# Patient Record
Sex: Male | Born: 1985 | Race: Black or African American | Hispanic: No | Marital: Single | Smoking: Former smoker
Health system: Southern US, Community
[De-identification: ages and names within clinical notes are randomized; demographics above are authoritative.]

---
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
## 2013-11-23 ENCOUNTER — Emergency Department (HOSPITAL_COMMUNITY): Payer: Self-pay

## 2013-11-23 ENCOUNTER — Emergency Department (HOSPITAL_COMMUNITY)
Admission: EM | Admit: 2013-11-23 | Discharge: 2013-11-23 | Disposition: A | Discharge status: Home / Self Care | Payer: Self-pay | Attending: Emergency Medicine | Admitting: Emergency Medicine

## 2013-11-23 ENCOUNTER — Encounter (HOSPITAL_COMMUNITY): Payer: Self-pay

## 2013-11-23 DIAGNOSIS — M25569 Pain in unspecified knee: Secondary | ICD-10-CM

## 2013-11-23 DIAGNOSIS — M23351 Other meniscus derangements, posterior horn of lateral meniscus, right knee: Secondary | ICD-10-CM

## 2013-11-23 DIAGNOSIS — R21 Rash and other nonspecific skin eruption: Secondary | ICD-10-CM | POA: Insufficient documentation

## 2013-11-23 DIAGNOSIS — Z720 Tobacco use: Secondary | ICD-10-CM | POA: Insufficient documentation

## 2013-11-23 DIAGNOSIS — M238X1 Other internal derangements of right knee: Secondary | ICD-10-CM | POA: Insufficient documentation

## 2013-11-23 MED ORDER — TRAMADOL HCL 50 MG PO TABS: 50.0000 mg | ORAL_TABLET | Freq: Four times a day (QID) | ORAL | Status: DC | PRN

## 2013-11-23 MED ORDER — IBUPROFEN 800 MG PO TABS: 800.0000 mg | ORAL_TABLET | Freq: Three times a day (TID) | ORAL | Status: DC

## 2013-11-23 MED ORDER — IBUPROFEN 800 MG PO TABS
800.0000 mg | ORAL_TABLET | Freq: Once | ORAL | Status: AC
  Administered 2013-11-23: 800 mg via ORAL
  Filled 2013-11-23: qty 1

## 2013-11-23 NOTE — ED Provider Notes (Signed)
CSN: [id number]     Arrival date & time 11/23/13  1509 History  This chart was scribed for non-physician practitioner working with Brent Munchobert Lockwood, MD by Richarda Overlieichard Holland, ED Scribe. This patient was seen in room WTR7/WTR7 and the patient's care was started at 4:51 PM.     Chief Complaint  Patient presents with  . Knee Pain   The history is provided by the patient. No language interpreter was used.   HPI Comments: Brent Fuentes is a 28 y.o. male who presents to the Emergency Department complaining of right knee pain that started 2 days ago. He was sitting and when he went to stand felt a popping sensation in his knee. He describes the pain as intermittent and sharp that radiates down his right leg. He states he has had this sensation one time in the past where he felt like his knee was out of place. He states his pain is a 6-8/10 when he walks. He states that he was able to get "it back in place." Pt has tried tylenol with no relief. Pt has tried to pop it back in place unsuccessfuly, and ha been using an ACE wrap. No surgical history on the knee. He denies fevers and chills.   Pt complains of a rash in his groin that has been there for the past 2 weeks. He states it has been pruritic and says he has used hydrocortisone cream which failed to relieve his itching. He says the area has not gotten any bigger. He reports he had dysuria for a few days last week that since has self resolved. He reports 1 sexual partner. Denies hematuria, urgency or frequency.     No past medical history on file. No past surgical history on file. No family history on file. History  Substance Use Topics  . Smoking status: Current Every Day Smoker    Types: Cigarettes  . Smokeless tobacco: Not on file  . Alcohol Use: Yes    Review of Systems  Constitutional: Negative for fever and chills.  Genitourinary: Negative for hematuria and testicular pain.  Musculoskeletal: Positive for arthralgias.  Skin: Positive for  rash.    Allergies  Review of patient's allergies indicates no known allergies.  Home Medications   Prior to Admission medications   Not on File   BP 127/78 mmHg  Pulse 83  Temp(Src) 97.5 F (36.4 C) (Oral)  Resp 18  SpO2 100% Physical Exam  Constitutional: He is oriented to person, place, and time. He appears well-developed and well-nourished.  HENT:  Head: Normocephalic and atraumatic.  Neck: Normal range of motion. Neck supple. No tracheal deviation present.  Cardiovascular: Normal rate.   Pulmonary/Chest: Effort normal. No respiratory distress.  Abdominal: He exhibits no distension.  Musculoskeletal:  Right knee tenderness noted to the lateral aspect. Moderate tenderness around knee cap. Normal knee flexion/extension. No joint laxity on anterior posterior drawer test. Pain with various maneuvers.   Neurological: He is alert and oriented to person, place, and time.  Skin: Skin is warm and dry.  Macular papular rash noted to bilateral medial inguinal region without any obvious sings of infection.   Psychiatric: He has a normal mood and affect. His behavior is normal.  Nursing note and vitals reviewed.   ED Course  Procedures  DIAGNOSTIC STUDIES: Oxygen Saturation is 100% on RA, normal by my interpretation.    COORDINATION OF CARE: 4:58 PM Discussed treatment plan with pt at bedside and pt agreed to plan. Ordered rt knee x-ray.  Advised pt to take ibuprofen for pain. Advised pt to try OTC anti-fungal cream for his rash and use once or twice daily for three weeks.    5:08 PM Xray neg.  Given focal point tenderness to latera aspect of R knee, i suspect meniscal injury.  Knee sleeve provided.  Pt has crutches he can use at home.  Ibuprofen provided.  RICE therapy discussed.  Orthopedic referral given.  Pt also has a localized rash to medial thigh bilat.  Suspect fungal infection given the pruritic nature.  Recommend OTC lamisil as treatment.  Return precaution given.     Labs Review Labs Reviewed - No data to display  Imaging Review Dg Knee Complete 4 Views Right  11/23/2013   CLINICAL DATA:  Right knee pain for 2 days, no known injury all my initial encounter  EXAM: RIGHT KNEE - COMPLETE 4+ VIEW  COMPARISON:  None.  FINDINGS: There is no evidence of fracture, dislocation, or joint effusion. There is no evidence of arthropathy or other focal bone abnormality. Soft tissues are unremarkable. Small bone island is noted in the proximal tibia laterally  IMPRESSION: No acute abnormality seen.   Electronically Signed   By: Alcide CleverMark  Lukens M.D.   On: 11/23/2013 16:56     EKG Interpretation None      MDM   Final diagnoses:  Internal derangement of right knee involving posterior horn of lateral meniscus   BP 127/78 mmHg  Pulse 83  Temp(Src) 97.5 F (36.4 C) (Oral)  Resp 18  SpO2 100%  I have reviewed nursing notes and vital signs. I personally reviewed the imaging tests through PACS system  I reviewed available ER/hospitalization records thought the EMR  I personally performed the services described in this documentation, which was scribed in my presence. The recorded information has been reviewed and is accurate.      Fayrene HelperBowie Raniya Golembeski, PA-C 11/23/13 1712  Brent Munchobert Lockwood, MD 11/23/13 [phone]

## 2013-11-23 NOTE — Discharge Instructions (Signed)
Your pain may indicate a meniscal injury.  Please wear knee brace, use crutches as needed, take pain medication as prescribed and follow up closely with orthopedist specialist for further care.    Knee, Cartilage (Meniscus) Injury It is suspected that you have a torn cartilage (meniscus) in your knee. The menisci are made of tough cartilage and fit between the surfaces of the thigh and leg bones. The menisci are C-shaped and have a wedged profile. The wedged profile helps the stability of the joint by keeping the rounded femur surface from sliding off the flat tibial surface. The menisci are fed (nourished) by small blood vessels, but there is also a large area at the inner edge of the meniscus that does not have a good blood supply (avascular). This presents a problem when there is an injury to the meniscus because areas without good blood supply heal poorly. As a result when there is a torn cartilage in the knee, surgery is often required to fix it. This is usually done with a surgical procedure less invasive than open surgery (arthroscopy). Some times open surgery of the knee is required if there is other damage. PURPOSE OF THE MENISCUS The medial meniscus rests on the medial tibial plateau. The tibia is the large bone in your lower leg (the shin bone). The medial tibial plateau is the upper end of the bone making up the inner part of your knee. The lateral meniscus serves the same purpose and is located on the outside of the knee. The menisci help to distribute your body weight across the knee joint; they act as shock absorbers. Without the meniscus present, the weight of your body would be unevenly applied to the bones in your legs (the femur and tibia). The femur is the large bone in your thigh. This uneven weight distribution would cause increased wear and tear on the cartilage lining the joint surfaces, leading to early damage (arthritis) of these areas. The presence of the menisci cartilage is  necessary for a healthy knee. PURPOSE OF THE KNEE CARTILAGE The knee joint is made up of three bones: the thigh bone (femur), the shin bone (tibia), and the knee cap (patella). The surfaces of these bones at the knee joint are covered with cartilage called articular cartilage. This smooth, slippery surface allows the bones to slide against each other without causing bone damage. The meniscus sits between these cartilaginous surfaces of the bones. It distributes the weight evenly in the joints and helps with the stability of the joint (keeps the joint steady). HOME CARE INSTRUCTIONS  Use crutches and external braces as instructed.  Once home, an ice pack applied to your injured knee may help with discomfort and keep the swelling down. An ice pack can be used for the first couple of days or as instructed.  Only take over-the-counter or prescription medicines for pain, discomfort, or fever as directed by your caregiver.  Call if you do not have relief of pain with medications or if there is increasing in pain.  Call if your foot becomes cold or blue.  You may resume normal diet and activities as directed.  Make sure to keep your appointments with your follow-up caregiver. This injury may require further evaluation and treatment beyond the temporary treatment given today. Document Released: 03/21/2002 Document Revised: 05/15/2013 Document Reviewed: 07/13/2008 [facility] Patient Information 2015 [place], [place]. This information is not intended to replace advice given to you by your health care provider. Make sure you discuss any questions you  have with your health care provider. ° °

## 2013-11-23 NOTE — ED Notes (Signed)
Pt states that on Tuesday night, he attempted to get up off ground and felt pop in knee.  Pt is ambulatory but c/o knee to right knee.

## 2013-12-21 ENCOUNTER — Encounter (HOSPITAL_COMMUNITY): Payer: Self-pay

## 2013-12-21 ENCOUNTER — Emergency Department (HOSPITAL_COMMUNITY)
Admission: EM | Admit: 2013-12-21 | Discharge: 2013-12-21 | Disposition: A | Discharge status: Home / Self Care | Payer: Self-pay | Attending: Emergency Medicine | Admitting: Emergency Medicine

## 2013-12-21 ENCOUNTER — Emergency Department (HOSPITAL_COMMUNITY): Payer: Self-pay

## 2013-12-21 DIAGNOSIS — N451 Epididymitis: Secondary | ICD-10-CM | POA: Insufficient documentation

## 2013-12-21 DIAGNOSIS — N50812 Left testicular pain: Secondary | ICD-10-CM

## 2013-12-21 DIAGNOSIS — Z720 Tobacco use: Secondary | ICD-10-CM | POA: Insufficient documentation

## 2013-12-21 DIAGNOSIS — Z791 Long term (current) use of non-steroidal anti-inflammatories (NSAID): Secondary | ICD-10-CM | POA: Insufficient documentation

## 2013-12-21 LAB — URINALYSIS, ROUTINE W REFLEX MICROSCOPIC
BILIRUBIN URINE: NEGATIVE
GLUCOSE, UA: NEGATIVE mg/dL
KETONES UR: NEGATIVE mg/dL
Nitrite: NEGATIVE
Protein, ur: 30 mg/dL — AB
Specific Gravity, Urine: 1.026 (ref 1.005–1.030)
Urobilinogen, UA: 2 mg/dL — ABNORMAL HIGH (ref 0.0–1.0)
pH: 7 (ref 5.0–8.0)

## 2013-12-21 LAB — URINE MICROSCOPIC-ADD ON

## 2013-12-21 MED ORDER — DOXYCYCLINE HYCLATE 100 MG PO TABS
100.0000 mg | ORAL_TABLET | Freq: Once | ORAL | Status: AC
  Administered 2013-12-21: 100 mg via ORAL
  Filled 2013-12-21: qty 1

## 2013-12-21 MED ORDER — LIDOCAINE HCL 1 % IJ SOLN
INTRAMUSCULAR | Status: AC
  Administered 2013-12-21: 1 mL
  Filled 2013-12-21: qty 20

## 2013-12-21 MED ORDER — DOXYCYCLINE HYCLATE 100 MG PO CAPS: 100.0000 mg | ORAL_CAPSULE | Freq: Two times a day (BID) | ORAL | Status: DC

## 2013-12-21 MED ORDER — CEFTRIAXONE SODIUM 250 MG IJ SOLR
250.0000 mg | Freq: Once | INTRAMUSCULAR | Status: AC
  Administered 2013-12-21: 250 mg via INTRAMUSCULAR
  Filled 2013-12-21: qty 250

## 2013-12-21 MED ORDER — OXYCODONE-ACETAMINOPHEN 5-325 MG PO TABS
2.0000 | ORAL_TABLET | Freq: Once | ORAL | Status: AC
  Administered 2013-12-21: 2 via ORAL
  Filled 2013-12-21: qty 2

## 2013-12-21 MED ORDER — OXYCODONE-ACETAMINOPHEN 5-325 MG PO TABS: 1.0000 | ORAL_TABLET | Freq: Four times a day (QID) | ORAL | Status: DC | PRN

## 2013-12-21 NOTE — ED Notes (Signed)
Urinal was given to patient and GC chlamydia is placed at bed side.

## 2013-12-21 NOTE — Discharge Instructions (Signed)
Epididymitis °Epididymitis is a swelling (inflammation) of the epididymis. The epididymis is a cord-like structure along the back part of the testicle. Epididymitis is usually, but not always, caused by infection. This is usually a sudden problem beginning with chills, fever and pain behind the scrotum and in the testicle. There may be swelling and redness of the testicle. °DIAGNOSIS  °Physical examination will reveal a tender, swollen epididymis. Sometimes, cultures are obtained from the urine or from prostate secretions to help find out if there is an infection or if the cause is a different problem. Sometimes, blood work is performed to see if your white blood cell count is elevated and if a germ (bacterial) or viral infection is present. Using this knowledge, an appropriate medicine which kills germs (antibiotic) can be chosen by your caregiver. A viral infection causing epididymitis will most often go away (resolve) without treatment. °HOME CARE INSTRUCTIONS  °· Hot sitz baths for 20 minutes, 4 times per day, may help relieve pain. °· Only take over-the-counter or prescription medicines for pain, discomfort or fever as directed by your caregiver. °· Take all medicines, including antibiotics, as directed. Take the antibiotics for the full prescribed length of time even if you are feeling better. °· It is very important to keep all follow-up appointments. °SEEK IMMEDIATE MEDICAL CARE IF:  °· You have a fever. °· You have pain not relieved with medicines. °· You have any worsening of your problems. °· Your pain seems to come and go. °· You develop pain, redness, and swelling in the scrotum and surrounding areas. °MAKE SURE YOU:  °· Understand these instructions. °· Will watch your condition. °· Will get help right away if you are not doing well or get worse. °Document Released: 12/27/1999 Document Revised: 03/23/2011 Document Reviewed: 11/15/2008 °ExitCare® Patient Information ©2015 [employer]. This information  is not intended to replace advice given to you by your health care provider. Make sure you discuss any questions you have with your health care provider. ° °

## 2013-12-21 NOTE — ED Provider Notes (Signed)
CSN: [id number]     Arrival date & time 12/21/13  0805 History   First MD Initiated Contact with Patient 12/21/13 [phone]     Chief Complaint  Patient presents with  . Testicle Pain     (Consider location/radiation/quality/duration/timing/severity/associated sxs/prior Treatment) Patient is a 28 y.o. male presenting with testicular pain. The history is provided by the patient.  Testicle Pain This is a new problem. The current episode started more than 2 days ago. The problem occurs constantly. The problem has not changed since onset.Pertinent negatives include no shortness of breath. Nothing aggravates the symptoms. Nothing relieves the symptoms. He has tried nothing for the symptoms.    History reviewed. No pertinent past medical history. History reviewed. No pertinent past surgical history. History reviewed. No pertinent family history. History  Substance Use Topics  . Smoking status: Current Every Day Smoker    Types: Cigarettes  . Smokeless tobacco: Not on file  . Alcohol Use: Yes    Review of Systems  Constitutional: Negative for fever and chills.  Respiratory: Negative for cough and shortness of breath.   Genitourinary: Positive for testicular pain.  All other systems reviewed and are negative.     Allergies  Review of patient's allergies indicates no known allergies.  Home Medications   Prior to Admission medications   Medication Sig Start Date End Date Taking? Authorizing Provider  ibuprofen (ADVIL,MOTRIN) 800 MG tablet Take 1 tablet (800 mg total) by mouth 3 (three) times daily. 11/23/13  Yes Fayrene HelperBowie Tran, PA-C  traMADol (ULTRAM) 50 MG tablet Take 1 tablet (50 mg total) by mouth every 6 (six) hours as needed for severe pain. 11/23/13  Yes Fayrene HelperBowie Tran, PA-C   BP 132/84 mmHg  Pulse 95  Temp(Src) 97.9 F (36.6 C) (Oral)  Resp 18  SpO2 100% Physical Exam  Constitutional: He is oriented to person, place, and time. He appears well-developed and well-nourished. No  distress.  HENT:  Head: Normocephalic and atraumatic.  Mouth/Throat: No oropharyngeal exudate.  Eyes: EOM are normal. Pupils are equal, round, and reactive to light.  Neck: Normal range of motion. Neck supple.  Cardiovascular: Normal rate and regular rhythm.  Exam reveals no friction rub.   No murmur heard. Pulmonary/Chest: Effort normal and breath sounds normal. No respiratory distress. He has no wheezes. He has no rales.  Abdominal: He exhibits no distension. There is no tenderness. There is no rebound. Hernia confirmed negative in the right inguinal area and confirmed negative in the left inguinal area.  Genitourinary: Right testis shows no mass, no swelling and no tenderness. Right testis is descended. Left testis shows mass (nodular, enlarged), swelling and tenderness (severe). Left testis is descended.  Musculoskeletal: Normal range of motion. He exhibits no edema.  Lymphadenopathy:       Right: No inguinal adenopathy present.       Left: No inguinal adenopathy present.  Neurological: He is alert and oriented to person, place, and time.  Skin: He is not diaphoretic.  Nursing note and vitals reviewed.   ED Course  Procedures (including critical care time) Labs Review Labs Reviewed  GC/CHLAMYDIA PROBE AMP  URINALYSIS, ROUTINE W REFLEX MICROSCOPIC    Imaging Review [place] Scrotum  12/21/2013   CLINICAL DATA:  Left testicle pain  EXAM: ULTRASOUND OF SCROTUM  TECHNIQUE: Complete ultrasound examination of the testicles, epididymis, and other scrotal structures was performed.  COMPARISON:  None.  FINDINGS: Right testicle  Measurements: 4.0 x 2.1 x 2.4 cm. No mass or microlithiasis visualized.  Left testicle  Measurements: 3.3 x 2.1 x 2.4 cm. No mass or microlithiasis visualized.  Right epididymis:  Normal in size and appearance.  Left epididymis: Enlarged epididymis on the left with increased blood flow suggesting epididymitis. Negative for mass or fluid collection.  Hydrocele:  None  visualized.  Varicocele:  None visualized.  IMPRESSION: Enlarged left epididymis with increased blood flow indicative of epididymitis.  Negative for testicular torsion or mass.   Electronically Signed   By: Marlan Palauharles  Clark M.D.   On: 12/21/2013 09:19   [place] Art/ven Flow Abd Pelv Doppler  12/21/2013   CLINICAL DATA:  Left testicle pain  EXAM: ULTRASOUND OF SCROTUM  TECHNIQUE: Complete ultrasound examination of the testicles, epididymis, and other scrotal structures was performed.  COMPARISON:  None.  FINDINGS: Right testicle  Measurements: 4.0 x 2.1 x 2.4 cm. No mass or microlithiasis visualized.  Left testicle  Measurements: 3.3 x 2.1 x 2.4 cm. No mass or microlithiasis visualized.  Right epididymis:  Normal in size and appearance.  Left epididymis: Enlarged epididymis on the left with increased blood flow suggesting epididymitis. Negative for mass or fluid collection.  Hydrocele:  None visualized.  Varicocele:  None visualized.  IMPRESSION: Enlarged left epididymis with increased blood flow indicative of epididymitis.  Negative for testicular torsion or mass.   Electronically Signed   By: Marlan Palauharles  Clark M.D.   On: 12/21/2013 09:19     EKG Interpretation None      MDM   Final diagnoses:  Pain in left testicle  Epididymitis    46M here with 3 days of left testicle pain. Began after using the bathroom. No trauma. No fevers. No penile discharge, no dysuria or difficulty urinating. On exam, L testicle swollen, nodular. Will US. US shows L sided epididymitis. No evidence of torsion. Does not practice insertive anal sex, can discharge with doxycycline.     Elwin MochaBlair Bernardina Cacho, MD 12/21/13 [phone]

## 2013-12-21 NOTE — ED Notes (Signed)
Per pt, pain and swelling on left testicle since Monday.  Started after using "restroom".  Denies physical activity onset.  Pt states no fever, change in urination or penile discharge.

## 2013-12-21 NOTE — ED Notes (Signed)
Patient states he can not urinate at this time. Will try again later

## 2013-12-22 LAB — GC/CHLAMYDIA PROBE AMP
CT Probe RNA: NEGATIVE
GC Probe RNA: POSITIVE — AB

## 2013-12-23 ENCOUNTER — Telehealth: Payer: Self-pay | Admitting: *Deleted

## 2014-03-12 ENCOUNTER — Telehealth (HOSPITAL_COMMUNITY): Payer: Self-pay

## 2014-03-12 NOTE — Telephone Encounter (Signed)
[facility] 

## 2014-07-02 ENCOUNTER — Encounter (HOSPITAL_COMMUNITY): Payer: Self-pay

## 2014-07-02 ENCOUNTER — Emergency Department (HOSPITAL_COMMUNITY)
Admission: EM | Admit: 2014-07-02 | Discharge: 2014-07-02 | Disposition: A | Discharge status: Home / Self Care | Payer: Self-pay | Attending: Emergency Medicine | Admitting: Emergency Medicine

## 2014-07-02 DIAGNOSIS — R1084 Generalized abdominal pain: Secondary | ICD-10-CM | POA: Insufficient documentation

## 2014-07-02 DIAGNOSIS — R630 Anorexia: Secondary | ICD-10-CM | POA: Insufficient documentation

## 2014-07-02 DIAGNOSIS — Z720 Tobacco use: Secondary | ICD-10-CM | POA: Insufficient documentation

## 2014-07-02 DIAGNOSIS — A770 Spotted fever due to Rickettsia rickettsii: Secondary | ICD-10-CM | POA: Insufficient documentation

## 2014-07-02 LAB — URINALYSIS, ROUTINE W REFLEX MICROSCOPIC
BILIRUBIN URINE: NEGATIVE
Glucose, UA: NEGATIVE mg/dL
Hgb urine dipstick: NEGATIVE
KETONES UR: NEGATIVE mg/dL
Nitrite: NEGATIVE
Protein, ur: NEGATIVE mg/dL
Specific Gravity, Urine: 1.02 (ref 1.005–1.030)
Urobilinogen, UA: 1 mg/dL (ref 0.0–1.0)
pH: 6 (ref 5.0–8.0)

## 2014-07-02 LAB — COMPREHENSIVE METABOLIC PANEL
ALBUMIN: 3.9 g/dL (ref 3.5–5.0)
ALK PHOS: 75 U/L (ref 38–126)
ALT: 16 U/L — ABNORMAL LOW (ref 17–63)
AST: 18 U/L (ref 15–41)
Anion gap: 9 (ref 5–15)
BUN: 9 mg/dL (ref 6–20)
CO2: 21 mmol/L — AB (ref 22–32)
Calcium: 8.9 mg/dL (ref 8.9–10.3)
Chloride: 107 mmol/L (ref 101–111)
Creatinine, Ser: 0.98 mg/dL (ref 0.61–1.24)
GLUCOSE: 93 mg/dL (ref 65–99)
POTASSIUM: 4.3 mmol/L (ref 3.5–5.1)
Sodium: 137 mmol/L (ref 135–145)
Total Bilirubin: 0.2 mg/dL — ABNORMAL LOW (ref 0.3–1.2)
Total Protein: 8.1 g/dL (ref 6.5–8.1)

## 2014-07-02 LAB — CBC WITH DIFFERENTIAL/PLATELET
BASOS ABS: 0 10*3/uL (ref 0.0–0.1)
Basophils Relative: 1 % (ref 0–1)
Eosinophils Absolute: 0.1 10*3/uL (ref 0.0–0.7)
Eosinophils Relative: 1 % (ref 0–5)
HCT: 44 % (ref 39.0–52.0)
HEMOGLOBIN: 14.5 g/dL (ref 13.0–17.0)
LYMPHS PCT: 38 % (ref 12–46)
Lymphs Abs: 3.2 10*3/uL (ref 0.7–4.0)
MCH: 28.2 pg (ref 26.0–34.0)
MCHC: 33 g/dL (ref 30.0–36.0)
MCV: 85.6 fL (ref 78.0–100.0)
Monocytes Absolute: 0.4 10*3/uL (ref 0.1–1.0)
Monocytes Relative: 5 % (ref 3–12)
NEUTROS ABS: 4.5 10*3/uL (ref 1.7–7.7)
Neutrophils Relative %: 55 % (ref 43–77)
Platelets: 336 10*3/uL (ref 150–400)
RBC: 5.14 MIL/uL (ref 4.22–5.81)
RDW: 14.1 % (ref 11.5–15.5)
WBC: 8.2 10*3/uL (ref 4.0–10.5)

## 2014-07-02 LAB — URINE MICROSCOPIC-ADD ON

## 2014-07-02 LAB — LIPASE, BLOOD: LIPASE: 18 U/L — AB (ref 22–51)

## 2014-07-02 LAB — I-STAT TROPONIN, ED: TROPONIN I, POC: 0 ng/mL (ref 0.00–0.08)

## 2014-07-02 MED ORDER — PENICILLIN G BENZATHINE 1200000 UNIT/2ML IM SUSP
2.4000 10*6.[IU] | Freq: Once | INTRAMUSCULAR | Status: AC
  Administered 2014-07-02: 2.4 10*6.[IU] via INTRAMUSCULAR
  Filled 2014-07-02: qty 4

## 2014-07-02 MED ORDER — DOXYCYCLINE HYCLATE 100 MG PO CAPS: 100.0000 mg | ORAL_CAPSULE | Freq: Two times a day (BID) | ORAL | Status: DC

## 2014-07-02 NOTE — ED Notes (Signed)
Pt escorted to discharge window. Pt verbalized understanding discharge instructions. In no acute distress.  

## 2014-07-02 NOTE — ED Provider Notes (Signed)
CSN: [id number]     Arrival date & time 07/02/14  1046 History   First MD Initiated Contact with Patient 07/02/14 1210     Chief Complaint  Patient presents with  . Fatigue  . Sore Throat  . Rash  . Abdominal Pain     (Consider location/radiation/quality/duration/timing/severity/associated sxs/prior Treatment) HPI Comments: Patient was hiking at [facility] about 2-3 weeks ago, did not have any known insect bites.  He denies GU symptoms including rash, ulcerations, dysuria or penile discharge.  No history of STI.  No intraoral lesions, though has had some sore throat  Patient is a 29 y.o. male presenting with pharyngitis, rash, and abdominal pain.  Sore Throat This is a new problem. The current episode started more than 1 week ago (2wks). The problem occurs constantly. The problem has not changed since onset.Associated symptoms include abdominal pain and headaches. Pertinent negatives include no chest pain and no shortness of breath. Associated symptoms comments:  headache, anorexia, fatigue, chills. Nothing aggravates the symptoms. Nothing relieves the symptoms. He has tried nothing for the symptoms. The treatment provided no relief.  Rash Associated symptoms: abdominal pain, fatigue and headaches   Associated symptoms: no diarrhea, no fever, no nausea, no shortness of breath and not vomiting   Abdominal Pain Associated symptoms: chills and fatigue   Associated symptoms: no chest pain, no constipation, no cough, no diarrhea, no dysuria, no fever, no nausea, no shortness of breath and no vomiting     History reviewed. No pertinent past medical history. History reviewed. No pertinent past surgical history. History reviewed. No pertinent family history. History  Substance Use Topics  . Smoking status: Current Every Day Smoker    Types: Cigarettes  . Smokeless tobacco: Not on file  . Alcohol Use: Yes    Review of Systems  Constitutional: Positive for chills, appetite change and  fatigue. Negative for fever and activity change.  HENT: Negative for congestion, facial swelling, rhinorrhea and trouble swallowing.   Eyes: Negative for photophobia and pain.  Respiratory: Negative for cough, chest tightness and shortness of breath.   Cardiovascular: Negative for chest pain and leg swelling.  Gastrointestinal: Positive for abdominal pain. Negative for nausea, vomiting, diarrhea and constipation.  Endocrine: Negative for polydipsia and polyuria.  Genitourinary: Negative for dysuria, urgency, decreased urine volume and difficulty urinating.  Musculoskeletal: Negative for back pain and gait problem.  Skin: Positive for rash. Negative for color change and wound.  Allergic/Immunologic: Negative for immunocompromised state.  Neurological: Positive for headaches. Negative for dizziness, facial asymmetry, speech difficulty, weakness and numbness.  Psychiatric/Behavioral: Negative for confusion, decreased concentration and agitation.      Allergies  Review of patient's allergies indicates no known allergies.  Home Medications   Prior to Admission medications   Medication Sig Start Date End Date Taking? Authorizing Provider  doxycycline (VIBRAMYCIN) 100 MG capsule Take 1 capsule (100 mg total) by mouth 2 (two) times daily. One po bid x 7 days 07/02/14   Toy Cookey, MD  ibuprofen (ADVIL,MOTRIN) 800 MG tablet Take 1 tablet (800 mg total) by mouth 3 (three) times daily. Patient not taking: Reported on 07/02/2014 11/23/13   Fayrene Helper, PA-C  oxyCODONE-acetaminophen (PERCOCET/ROXICET) 5-325 MG per tablet Take 1 tablet by mouth every 6 (six) hours as needed for moderate pain or severe pain. Patient not taking: Reported on 07/02/2014 12/21/13   Elwin Mocha, MD  traMADol (ULTRAM) 50 MG tablet Take 1 tablet (50 mg total) by mouth every 6 (six) hours as needed for  severe pain. Patient not taking: Reported on 07/02/2014 11/23/13   Fayrene Helper, PA-C   BP 123/68 mmHg  Pulse 61  Temp(Src)  98 F (36.7 C) (Oral)  Resp 16  SpO2 99% Physical Exam  Constitutional: He is oriented to person, place, and time. He appears well-developed and well-nourished. No distress.  HENT:  Head: Normocephalic and atraumatic.  Mouth/Throat: No oropharyngeal exudate.  Eyes: Pupils are equal, round, and reactive to light.  Neck: Normal range of motion. Neck supple.  Cardiovascular: Normal rate, regular rhythm and normal heart sounds.  Exam reveals no gallop and no friction rub.   No murmur heard. Pulmonary/Chest: Effort normal and breath sounds normal. No respiratory distress. He has no wheezes. He has no rales.  Abdominal: Soft. Bowel sounds are normal. He exhibits no distension and no mass. There is no tenderness. There is no rebound and no guarding.  Musculoskeletal: Normal range of motion. He exhibits no edema or tenderness.  Neurological: He is alert and oriented to person, place, and time.  Skin: Skin is warm and dry.  Rounded hyperpigmented lesions to the palms and soles.  Several small shallow ulcerations to the low back.  No GU lesions  Psychiatric: He has a normal mood and affect.    ED Course  Procedures (including critical care time) Labs Review Labs Reviewed  COMPREHENSIVE METABOLIC PANEL - Abnormal; Notable for the following:    CO2 21 (*)    ALT 16 (*)    Total Bilirubin 0.2 (*)    All other components within normal limits  LIPASE, BLOOD - Abnormal; Notable for the following:    Lipase 18 (*)    All other components within normal limits  URINALYSIS, ROUTINE W REFLEX MICROSCOPIC (NOT AT [facility]) - Abnormal; Notable for the following:    Leukocytes, UA TRACE (*)    All other components within normal limits  CBC WITH DIFFERENTIAL/PLATELET  URINE MICROSCOPIC-ADD ON  RPR  HIV ANTIBODY (ROUTINE TESTING)  ROCKY MTN SPOTTED FVR ABS PNL(IGG+IGM)  I-STAT TROPOININ, ED    Imaging Review No results found.   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 male with Pmhx as above who presents with nontender rash on palms and soles as well as multiple systemic complaints including fatigue, sore throat, generalized abdominal pain and headache, with which started today.  He reports that the rash on his palms and his soles are not itchy or painful, though he does have several lesions on his low back that are itchy.  He was hiking in the woods about 2 weeks ago had no known tick bites.  He has had no known genital lesions, penile discharge or dysuria.  No history of STI.  On physical exam, vital signs are stable and he is in no acute distress, cardio pulmonary exam is benign.  His multiple rounded hyperpigmented lesions on palms and soles as well as 2-3 lesions on low back which are shallow ulcerated lesions.  No lesions of the GU area.  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  This patient has been hiking recently, the rash could also be consistent with secondary syphilis.  RMSF titer HIV and syphilis RPR have been sent.  Patient we treated with one dose of penicillin G and prescription for 1 week of by mouth doxycycline.  Given.  There have been no GU symptoms.  I more strongly suspect RMSF     Molly Maduro  Erck evaluation in the Emergency Department is complete. It has been determined that no acute conditions requiring further emergency intervention are present at this time. The patient/guardian have been advised of the diagnosis and plan. We have discussed signs and symptoms that warrant return to the ED, such as changes or worsening in symptoms, worsening pain, fever, trouble breathing      Toy Cookey, MD 07/02/14 1351

## 2014-07-02 NOTE — ED Notes (Signed)
Pt c/o rash on bilateral hands, lower back, and bottoms of feet x 2 weeks, sore throat x 1 week, and fatigue, generalized abdominal pain, and headache starting today.  Pain score 8/10.   Pt reports using "cream from a friend" on rash w/o relief.  Pt hasn't taken anything for pain.

## 2014-07-02 NOTE — Progress Notes (Signed)
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  CM discussed and provided written information for self pay pcps, discussed the importance of pcp vs EDP services for f/u care, www.needymeds.org, www.goodrx.com, discounted pharmacies and other [employer] such as [employer] [facility] [facility] , [employer], affordable care act,  [place] med assist, financial assistance, self pay dental services, [place] med assist, DSS and  health department  Reviewed resources for [employer] self pay pcps like [facility], family medicine at [facility], [facility], [facility], local urgent care centers, [facility], [facility] family practice, general medical clinics, family services of the [place], [facility] urgent care plus others, medication resources, [facility] out patient pharmacies and housing Pt voiced understanding and appreciation of resources provided   Provided [facility] contact information Pt states he will soon be getting coverage via his job via [employer].  Cm discussed how to obtain an in network pcp via new insurance website

## 2014-07-02 NOTE — ED Notes (Signed)
Pt alert and oriented x4. Respirations even and unlabored, bilateral symmetrical rise and fall of chest. Skin warm and dry. In no acute distress. Denies needs.   

## 2014-07-02 NOTE — Discharge Instructions (Signed)
[place] Spotted Fever °[place] Spotted Fever (RMSF) is the oldest known tick-borne disease of people in the [place]. This disease was named because it was first described among people in the [place] area who had an illness characterized by a rash with red-purple-black spots. This disease is caused by a rickettsia (Rickettsia rickettsii), a bacteria carried by the tick. °The [place] wood tick and the American dog tick acquire and transmit the RMSF bacteria (pictures NOT actual size). When a larval, nymphal, or adult tick feeds on an infected rodent or larger animal, the tick can become infected. Infected adult ticks then feed on people who may then get RMSF. The tick transmits the disease to humans during a prolonged period of feeding that lasts many hours, days, or even a couple weeks. The bite is painless and frequently goes unnoticed. An infected male tick may also pass the rickettsial bacteria to her eggs that then may mature to be infected adult ticks. °The rickettsia that causes RMSF can also get into a person's body through damaged skin. A tick bite is not necessary. People can get RMSF if they crush a tick and get its blood or body fluids on their skin through a small cut or sore.  °DIAGNOSIS °Diagnosis is made by laboratory tests.  °TREATMENT °Treatment is with antibiotics (medications that kill rickettsia and other bacteria). Immediate treatment usually prevents death. °GEOGRAPHIC RANGE °This disease was reported only in the [place] until 1931. RMSF has more recently been described among individuals in all states except [place], [place], and [place]. The highest reported incidences of RMSF now occur among residents of [place], [place], [place], and the [place]. °TIME OF YEAR  °Most cases are diagnosed during late spring and summer when ticks are most active. However, especially in the warmer southern states, a few cases occur during the winter. °SYMPTOMS   °· Symptoms of RMSF begin from 2 to 14 days after a tick bite. The most common early symptoms are fever, muscle aches, and headache followed by nausea (feeling sick to your stomach) or vomiting. °· The RMSF rash is typically delayed until 3 or more days after symptom onset, and eventually develops in 9 of 10 infected patients by the fifth day of illness. °If the disease is not treated it can cause death. If you get a fever, headache, muscle aches, rash, nausea, or vomiting within 2 weeks of a possible tick bite or exposure, you should see your caregiver immediately. °PREVENTION °Ticks prefer to hide in shady, moist ground litter. They can often be found above the ground clinging to tall grass, brush, shrubs and low tree branches. They also inhabit lawns and gardens, especially at the edges of woodlands and around old stone walls. Within the areas where ticks generally live, no naturally vegetated area can be considered completely free of infected ticks. The best precaution against RMSF is to avoid contact with soil, leaf litter, and vegetation as much as possible in tick-infested areas. For those who enjoy gardening or walking in their yards, clear brush and mow tall grass around houses and at the edges of gardens. This may help reduce the tick population in the immediate area. Applications of chemical insecticides by a licensed professional in the spring (late May) and fall (September) will also control ticks, especially in heavily infested areas. Treatment will never get rid of all the ticks. Getting rid of small animal populations that host ticks will also decrease the tick population. When working in the garden, pruning   shrubs, or handling soil and vegetation, wear light-colored protective clothing and gloves. Spot-check often to prevent ticks from reaching the skin. Ticks cannot jump or fly. They will not drop from an above-ground perch onto a passing animal. Once a tick gains access to human skin it climbs  upward until it reaches a more protected area. For example, the back of the knee, groin, navel, armpit, ears, or nape of the neck. It then begins the slow process of embedding itself in the skin. °Campers, hikers, field workers, and others who spend time in wooded, brushy, or tall grassy areas can avoid exposure to ticks by using the following precautions: °· Wear light-colored clothing with a tight weave to spot ticks more easily and prevent contact with the skin. °· Wear long pants tucked into socks, long-sleeved shirts tucked into pants and enclosed shoes or boots along with insect repellent. °· Spray clothes with insect repellent containing either DEET or Permethrin. Only DEET can be used on exposed skin. Follow the manufacturer's directions carefully. °· Wear a hat and keep long hair pulled back. °· Stay on cleared, well-worn trails whenever possible. °· Spot-check yourself and others often for the presence of ticks on clothes. If you find one, there are likely to be others. Check thoroughly. °· Remove clothes after leaving tick-infested areas. If possible, wash them to eliminate any unseen ticks. Check yourself, your children and any pets from head to toe for the presence of ticks. °· Shower and shampoo. °You can greatly reduce your chances of contracting RMSF if you remove attached ticks as soon as possible. Regular checks of the body, including all body sites covered by hair (head, armpits, genitals), allow removal of the tick before rickettsial transmission. To remove an attached tick, use a forceps or tweezers to detach the intact tick without leaving mouth parts in the skin. The tick bite wound should be cleansed after tick removal. °Remember the most common symptoms of RMSF are fever, muscle aches, headache, and nausea or vomiting with a later onset of rash. If you get these symptoms after a tick bite and while living in an area where RMSF is found, RMSF should be suspected. If the disease is not  treated, it can cause death. See your caregiver immediately if you get these symptoms. Do this even if not aware of a tick bite. °Document Released: 04/12/2000 Document Revised: 05/15/2013 Document Reviewed: 12/03/2008 °ExitCare® Patient Information ©2015 [employer]. This information is not intended to replace advice given to you by your health care provider. Make sure you discuss any questions you have with your health care provider. ° °

## 2014-07-03 LAB — RPR: RPR Ser Ql: REACTIVE — AB

## 2014-07-03 LAB — RPR, QUANT+TP ABS (REFLEX)
Rapid Plasma Reagin, Quant: 1:512 {titer} — ABNORMAL HIGH
T Pallidum Abs: POSITIVE — AB

## 2014-07-03 LAB — HIV ANTIBODY (ROUTINE TESTING W REFLEX): HIV SCREEN 4TH GENERATION: NONREACTIVE

## 2014-07-04 ENCOUNTER — Telehealth (HOSPITAL_BASED_OUTPATIENT_CLINIC_OR_DEPARTMENT_OTHER): Payer: Self-pay | Admitting: Emergency Medicine

## 2014-07-04 LAB — ROCKY MTN SPOTTED FVR ABS PNL(IGG+IGM)
RMSF IgG: NEGATIVE
RMSF IgM: 0.23 index (ref 0.00–0.89)

## 2014-07-04 NOTE — Telephone Encounter (Signed)
Pt notified of +RPR, was treated with Bicillin in the ED, educated re abstinence and notification of sexual partner(s)

## 2015-08-19 ENCOUNTER — Emergency Department (HOSPITAL_COMMUNITY): Payer: Managed Care, Other (non HMO)

## 2015-08-19 ENCOUNTER — Emergency Department (HOSPITAL_COMMUNITY)
Admission: EM | Admit: 2015-08-19 | Discharge: 2015-08-19 | Disposition: A | Discharge status: Home / Self Care | Payer: Managed Care, Other (non HMO) | Attending: Emergency Medicine | Admitting: Emergency Medicine

## 2015-08-19 ENCOUNTER — Encounter (HOSPITAL_COMMUNITY): Payer: Self-pay | Admitting: Vascular Surgery

## 2015-08-19 DIAGNOSIS — F17210 Nicotine dependence, cigarettes, uncomplicated: Secondary | ICD-10-CM | POA: Diagnosis not present

## 2015-08-19 DIAGNOSIS — R0789 Other chest pain: Secondary | ICD-10-CM | POA: Insufficient documentation

## 2015-08-19 LAB — BASIC METABOLIC PANEL
ANION GAP: 10 (ref 5–15)
BUN: 8 mg/dL (ref 6–20)
CHLORIDE: 102 mmol/L (ref 101–111)
CO2: 25 mmol/L (ref 22–32)
Calcium: 9.3 mg/dL (ref 8.9–10.3)
Creatinine, Ser: 1.16 mg/dL (ref 0.61–1.24)
GFR calc Af Amer: >60 mL/min (ref >60)
GFR calc non Af Amer: >60 mL/min (ref >60)
GLUCOSE: 93 mg/dL (ref 65–99)
POTASSIUM: 3.8 mmol/L (ref 3.5–5.1)
SODIUM: 137 mmol/L (ref 135–145)

## 2015-08-19 LAB — I-STAT TROPONIN, ED: Troponin i, poc: 0 ng/mL (ref 0.00–0.08)

## 2015-08-19 LAB — CBC
HEMATOCRIT: 46.2 % (ref 39.0–52.0)
HEMOGLOBIN: 14.9 g/dL (ref 13.0–17.0)
MCH: 27.7 pg (ref 26.0–34.0)
MCHC: 32.3 g/dL (ref 30.0–36.0)
MCV: 85.9 fL (ref 78.0–100.0)
Platelets: 355 10*3/uL (ref 150–400)
RBC: 5.38 MIL/uL (ref 4.22–5.81)
RDW: 13.7 % (ref 11.5–15.5)
WBC: 8.5 10*3/uL (ref 4.0–10.5)

## 2015-08-19 MED ORDER — NAPROXEN 375 MG PO TABS: 375.0000 mg | ORAL_TABLET | Freq: Two times a day (BID) | ORAL | 0 refills | Status: DC

## 2015-08-19 MED ORDER — CYCLOBENZAPRINE HCL 10 MG PO TABS: 10.0000 mg | ORAL_TABLET | Freq: Two times a day (BID) | ORAL | 0 refills | Status: DC | PRN

## 2015-08-19 MED ORDER — HYDROCODONE-ACETAMINOPHEN 5-325 MG PO TABS
2.0000 | ORAL_TABLET | Freq: Once | ORAL | Status: AC
  Administered 2015-08-19: 2 via ORAL
  Filled 2015-08-19: qty 2

## 2015-08-19 NOTE — ED Provider Notes (Signed)
[facility]-EMERGENCY DEPT Provider Note   CSN: [id number] Arrival date & time: 08/19/15  1217  First Provider Contact:  First MD Initiated Contact with Patient 08/19/15 1710        History   Chief Complaint Chief Complaint  Patient presents with  . Chest Pain    HPI Brent Fuentes is a 30 y.o. male.  Patient presents to the emergency department with chief complaint of chest pain. He states that the symptoms started 3 days ago. States that they're worsened when he takes a deep breath and when he flexes his chest or when his left chest is palpated. He denies any shortness breath. Denies any fevers, chills, or cough. He denies any history of asthma or COPD. He does smoke.   The history is provided by the patient. No language interpreter was used.    History reviewed. No pertinent past medical history.  There are no active problems to display for this patient.   History reviewed. No pertinent surgical history.     Home Medications    Prior to Admission medications   Medication Sig Start Date End Date Taking? Authorizing Provider  cyclobenzaprine (FLEXERIL) 10 MG tablet Take 1 tablet (10 mg total) by mouth 2 (two) times daily as needed for muscle spasms. 08/19/15   Roxy Horsemanobert Madolin Twaddle, PA-C  doxycycline (VIBRAMYCIN) 100 MG capsule Take 1 capsule (100 mg total) by mouth 2 (two) times daily. One po bid x 7 days 07/02/14   Toy CookeyMegan Docherty, MD  ibuprofen (ADVIL,MOTRIN) 800 MG tablet Take 1 tablet (800 mg total) by mouth 3 (three) times daily. Patient not taking: Reported on 07/02/2014 11/23/13   Fayrene HelperBowie Tran, PA-C  naproxen (NAPROSYN) 375 MG tablet Take 1 tablet (375 mg total) by mouth 2 (two) times daily. 08/19/15   Roxy Horsemanobert Euell Schiff, PA-C  oxyCODONE-acetaminophen (PERCOCET/ROXICET) 5-325 MG per tablet Take 1 tablet by mouth every 6 (six) hours as needed for moderate pain or severe pain. Patient not taking: Reported on 07/02/2014 12/21/13   Elwin MochaBlair Walden, MD  traMADol (ULTRAM) 50 MG tablet Take 1  tablet (50 mg total) by mouth every 6 (six) hours as needed for severe pain. Patient not taking: Reported on 07/02/2014 11/23/13   Fayrene HelperBowie Tran, PA-C    Family History History reviewed. No pertinent family history.  Social History Social History  Substance Use Topics  . Smoking status: Current Every Day Smoker    Types: Cigarettes  . Smokeless tobacco: Never Used  . Alcohol use Yes     Allergies   Review of patient's allergies indicates no known allergies.   Review of Systems Review of Systems  Cardiovascular: Positive for chest pain.  All other systems reviewed and are negative.    Physical Exam Updated Vital Signs BP 123/80 (BP Location: Left Arm)   Pulse (!) 55   Temp 98.1 F (36.7 C) (Oral)   Resp 17   Ht 6' (1.829 m)   Wt 131.5 kg   SpO2 100%   BMI 39.33 kg/m   Physical Exam  Constitutional: He is oriented to person, place, and time. He appears well-developed and well-nourished.  HENT:  Head: Normocephalic and atraumatic.  Eyes: Conjunctivae and EOM are normal. Pupils are equal, round, and reactive to light. Right eye exhibits no discharge. Left eye exhibits no discharge. No scleral icterus.  Neck: Normal range of motion. Neck supple. No JVD present.  Cardiovascular: Normal rate, regular rhythm and normal heart sounds.  Exam reveals no gallop and no friction rub.   No murmur  heard. Pulmonary/Chest: Effort normal and breath sounds normal. No respiratory distress. He has no wheezes. He has no rales. He exhibits no tenderness.  Left anterior chest wall tender to palpation, also reproducible pain with chest flexion  Abdominal: Soft. He exhibits no distension and no mass. There is no tenderness. There is no rebound and no guarding.  Musculoskeletal: Normal range of motion. He exhibits no edema or tenderness.  Neurological: He is alert and oriented to person, place, and time.  Skin: Skin is warm and dry.  Psychiatric: He has a normal mood and affect. His behavior is  normal. Judgment and thought content normal.  Nursing note and vitals reviewed.    ED Treatments / Results  Labs (all labs ordered are listed, but only abnormal results are displayed) Labs Reviewed  BASIC METABOLIC PANEL  CBC  I-STAT TROPOININ, ED    EKG  EKG Interpretation None       Radiology Dg Chest 2 View  Result Date: 08/19/2015 CLINICAL DATA:  Left chest pain getting worse since Saturday. EXAM: CHEST  2 VIEW COMPARISON:  None. FINDINGS: The heart size and mediastinal contours are within normal limits. Both lungs are clear. The visualized skeletal structures are unremarkable. IMPRESSION: No active cardiopulmonary disease. Electronically Signed   By: Kennith Center M.D.   On: 08/19/2015 13:25    Procedures Procedures (including critical care time)  Medications Ordered in ED Medications  HYDROcodone-acetaminophen (NORCO/VICODIN) 5-325 MG per tablet 2 tablet (not administered)     Initial Impression / Assessment and Plan / ED Course  I have reviewed the triage vital signs and the nursing notes.  Pertinent labs & imaging results that were available during my care of the patient were reviewed by me and considered in my medical decision making (see chart for details).  Clinical Course   Patient with chest pain 3 days. No associated shortness breath. Chest pain is reproducible with palpation and movement. Troponin ordered in triage is negative. EKG shows no ischemic findings. Patient is young and otherwise healthy, low risk for ACS. No tachycardia, no hypoxia, no leg swelling, low risk for PE. As the pain is reproducible, will treat for musculoskeletal pain. Treat with Flexeril and Naprosyn. Patient is stable and ready for discharge.  Final Clinical Impressions(s) / ED Diagnoses   Final diagnoses:  Chest wall pain    New Prescriptions New Prescriptions   CYCLOBENZAPRINE (FLEXERIL) 10 MG TABLET    Take 1 tablet (10 mg total) by mouth 2 (two) times daily as needed for  muscle spasms.   NAPROXEN (NAPROSYN) 375 MG TABLET    Take 1 tablet (375 mg total) by mouth 2 (two) times daily.     Roxy Horseman, PA-C 08/19/15 1724    Pricilla Loveless, MD 08/20/15 [phone]

## 2015-08-19 NOTE — ED Triage Notes (Signed)
Pt reports to the ED for eval of CP. Onset on Saturday. Movement and deep breaths make the pain worse. Describes it as a spasm-like pain. Pt reports some mild SOB when the pain is severe and some dizziness. Pt A&Ox4, resp e/u, and skin warm and dry.

## 2018-08-06 IMAGING — DX DG CHEST 2V
2 series · 2 of 2 positions shown · non-contrast
Comparison: None.

CLINICAL DATA: Left chest pain getting worse since [REDACTED].

EXAM:
CHEST  2 VIEW

[w chest pa]
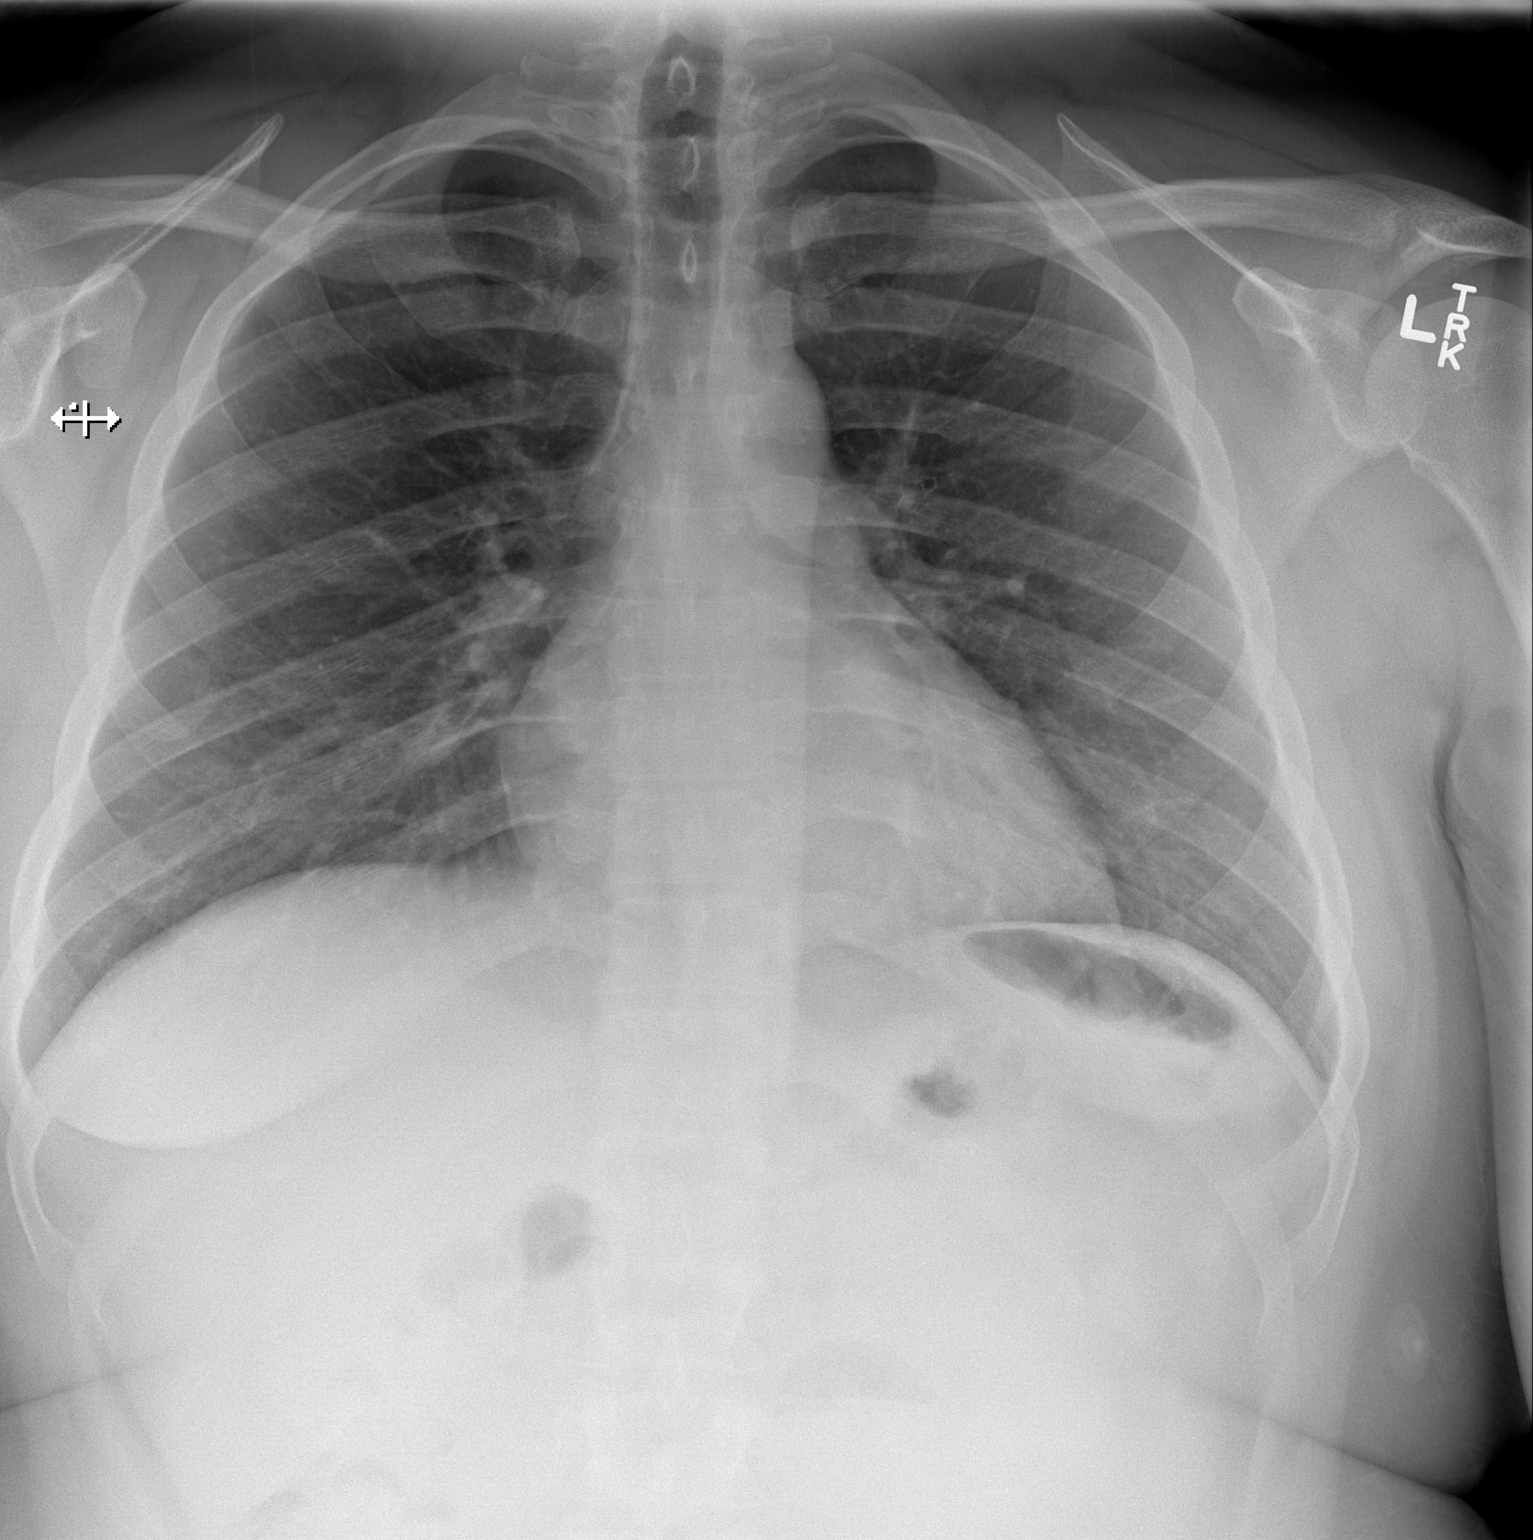

[w chest lat]
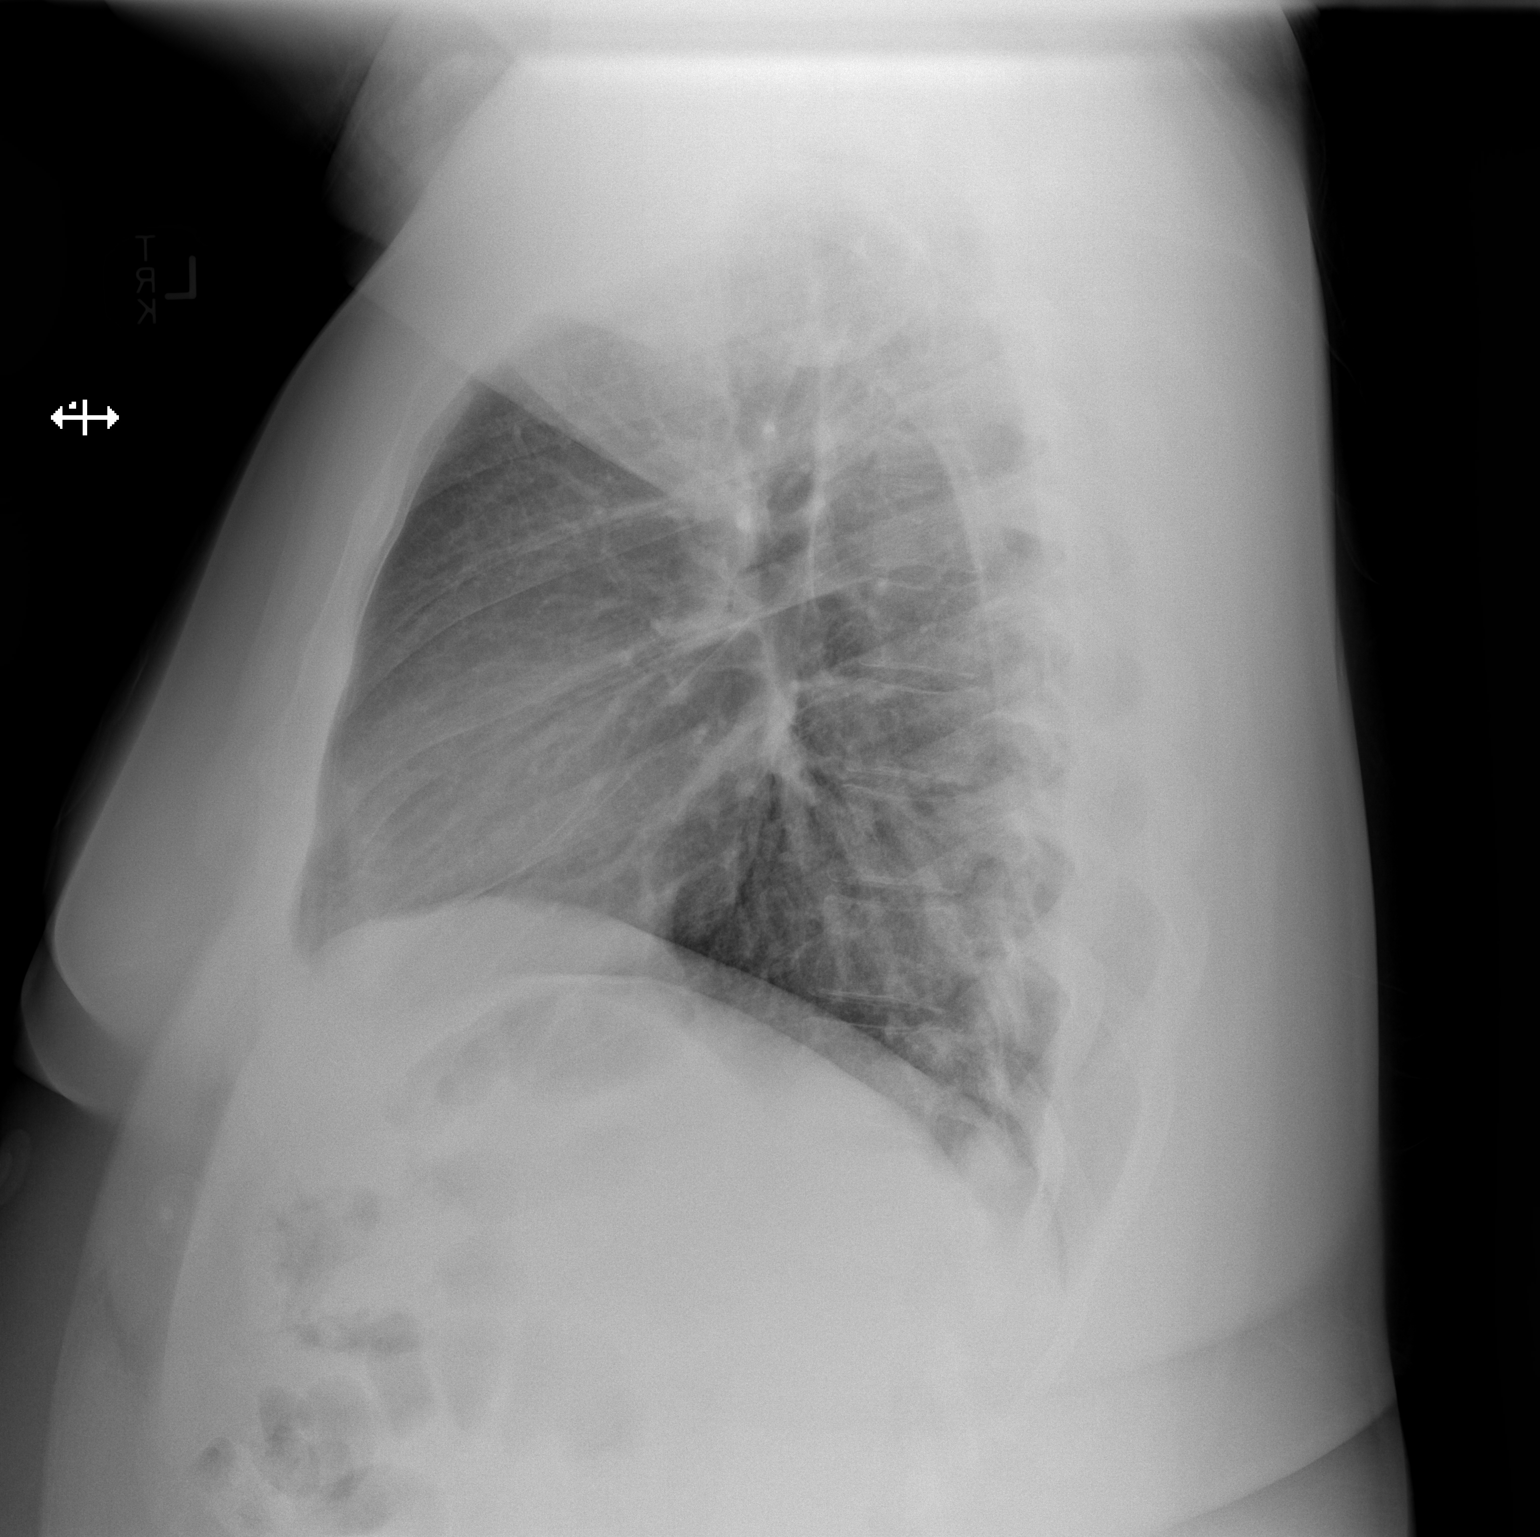

[2 of 2 positions shown; findings below may reference images not displayed]

FINDINGS: The heart size and mediastinal contours are within normal limits.
Both lungs are clear. The visualized skeletal structures are
unremarkable.
IMPRESSION: No active cardiopulmonary disease.

## 2020-06-03 ENCOUNTER — Ambulatory Visit
Admission: EM | Admit: 2020-06-03 | Discharge: 2020-06-03 | Disposition: A | Discharge status: Home / Self Care | Payer: Managed Care, Other (non HMO) | Attending: Emergency Medicine | Admitting: Emergency Medicine

## 2020-06-03 ENCOUNTER — Telehealth: Payer: Managed Care, Other (non HMO) | Admitting: Physician Assistant

## 2020-06-03 ENCOUNTER — Other Ambulatory Visit: Payer: Self-pay

## 2020-06-03 DIAGNOSIS — R1110 Vomiting, unspecified: Secondary | ICD-10-CM

## 2020-06-03 DIAGNOSIS — Z20822 Contact with and (suspected) exposure to covid-19: Secondary | ICD-10-CM

## 2020-06-03 DIAGNOSIS — K047 Periapical abscess without sinus: Secondary | ICD-10-CM | POA: Insufficient documentation

## 2020-06-03 DIAGNOSIS — R519 Headache, unspecified: Secondary | ICD-10-CM

## 2020-06-03 DIAGNOSIS — J0100 Acute maxillary sinusitis, unspecified: Secondary | ICD-10-CM | POA: Insufficient documentation

## 2020-06-03 LAB — POCT URINALYSIS DIP (MANUAL ENTRY)
Blood, UA: NEGATIVE
Glucose, UA: NEGATIVE mg/dL
Nitrite, UA: NEGATIVE
Protein Ur, POC: 30 mg/dL — AB
Spec Grav, UA: 1.015 (ref 1.010–1.025)
Urobilinogen, UA: 4 E.U./dL — AB
pH, UA: 6 (ref 5.0–8.0)

## 2020-06-03 MED ORDER — FLUTICASONE PROPIONATE 50 MCG/ACT NA SUSP: 2.0000 | Freq: Every day | NASAL | 0 refills | Status: AC

## 2020-06-03 MED ORDER — AMOXICILLIN-POT CLAVULANATE 875-125 MG PO TABS: 1.0000 | ORAL_TABLET | Freq: Two times a day (BID) | ORAL | 0 refills | Status: AC

## 2020-06-03 MED ORDER — ONDANSETRON 4 MG PO TBDP
4.0000 mg | ORAL_TABLET | Freq: Once | ORAL | Status: AC
  Administered 2020-06-03: 4 mg via ORAL

## 2020-06-03 MED ORDER — ACETAMINOPHEN 325 MG PO TABS
650.0000 mg | ORAL_TABLET | Freq: Once | ORAL | Status: AC
  Administered 2020-06-03: 650 mg via ORAL

## 2020-06-03 MED ORDER — ONDANSETRON 8 MG PO TBDP: ORAL_TABLET | ORAL | 0 refills | Status: AC

## 2020-06-03 MED ORDER — IBUPROFEN 600 MG PO TABS
600.0000 mg | ORAL_TABLET | Freq: Four times a day (QID) | ORAL | 0 refills | Status: AC | PRN
Start: 2020-06-03 — End: ?

## 2020-06-03 NOTE — ED Triage Notes (Signed)
Pt c/o headache, fatigue, nausea, and vomiting today. States no appetite. States wasn't feeling well yesterday. States last ibuprofen last night.

## 2020-06-03 NOTE — Progress Notes (Signed)
Based on what you shared with me, I feel your condition warrants further evaluation and I recommend that you be seen in a face to face office visit.  Vomiting in the setting of headache and fever is always concerning for a more complicated infection.  You will need a physical exam to ensure safe treatment of your symptoms.   NOTE: If you entered your credit card information for this eVisit, you will not be charged. You may see a "hold" on your card for the $35 but that hold will drop off and you will not have a charge processed.   If you are having a true medical emergency please call 911.      For an urgent face to face visit, [place] has six urgent care centers for your convenience:     [facility] at [facility] Directions [phone] [address] [place], [place] [address] . 8 am - 4 pm Monday - Friday    [facility]) Get Driving Directions [phone] [address] [place], [place] [address] . 8 am to 8 pm Monday-Friday . 10 am to 6 pm [facility] Urgent [facility]) Get Driving Directions [phone]  [address] [place],  [place]  [address] . 8 am to 8 pm Monday-Friday . 8 am to 4 pm [facility] Urgent Care at [facility] Get Driving Directions [phone] [address] [place], [place] [address] . 8 am to 8 pm Monday-Friday . 8 am to 4 pm [facility] Urgent Care at [facility] Get Driving Directions  [phone] [address] [place], [place] [address] . 8 am to 8 pm Monday-Friday . 8 am to 4 pm [facility] Urgent Care at [facility] Directions [phone] [address], [address] [place], [place] [address] . 8 am to 8 pm Monday-Friday . 8 am to 4 pm Saturday-Sunday     Your MyChart E-visit questionnaire answers  were reviewed by a board certified advanced clinical practitioner to complete your personal care plan based on your specific symptoms.  Thank you for using e-Visits.   Greater than 5 minutes, yet less than 10 minutes of time have been spent researching, coordinating, and implementing care for this patient today

## 2020-06-03 NOTE — Discharge Instructions (Addendum)
Your urine showed that you are dehydrated.  There is no blood in your urine that would be concerning for a kidney stone.  I am going to send your urine off for culture to make sure you have a urinary tract infection as well.  Take Zofran as needed for nausea, vomiting.  600 mg of ibuprofen combined with 1000 mg of Tylenol together 3-4 times a day as needed for pain, fever, body aches.  Make sure you drink plenty of extra electrolyte containing fluids such as Pedialyte or Gatorade.  Go immediately to the ER if your pain gets worse, for fevers despite the Tylenol/ibuprofen, or for any other concerns.  COVID and flu test will be back in several days.  Below is a list of primary care practices who are taking new patients for you to follow-up with.  [facility] internal medicine clinic Ground Floor - [facility], [address] [place], [place], [place] [address] [phone]  [facility] Primary Care at [facility] [address] [place], [place] [address] [phone]  [facility] [address] [place], [place] [address] [phone]  [facility]/Internal Medicine [phone] [address] [place] [address]  [facility]: [address] [place] [address]  [phone]  [facility]gent [facility]: [address] [place] [address]   [phone]  [facility] Family Medicine: [address] [place] [address]  [phone]  [facility] : [address] [place] [address] [phone]  [facility] Primary Care: [address] [place] [address] [phone]  [facility]: [address] [place] [address] [phone]  Dr. Oneal Grout [address] [facility] [place] [address]  [phone]  Go  to www.goodrx.com  or www.costplusdrugs.com to look up your medications. This will give you a list of where you can find your prescriptions at the most affordable prices. Or ask the pharmacist what the cash price is, or if they have any other discount programs available to help make your medication more affordable. This can be less expensive than what you would pay with insurance.

## 2020-06-03 NOTE — ED Provider Notes (Addendum)
HPI  SUBJECTIVE:  Brent Fuentes is a 35 y.o. male who presents with right upper molar pain, gingival swelling 3 days ago.  This has resolved.  He states that this tooth is cracked.  Denies recent trauma to the tooth.  He now reports Headaches, body aches, fevers T-max 101.7 measured here, fatigue, nasal congestion, rhinorrhea, sinus pain and pressure, cough, anorexia, nausea, 2 episodes of greenish emesis and upper intermittent abdominal pain described as throbbing, stabbing, pressure.  It lasts minutes to hours.  No wheezing, shortness of breath, loss of sense of smell or taste, sore throat.  He has not tried anything for his abdominal pain.  It is worse before vomiting, better afterwards.  He denies abdominal distention.  States that the car ride over here was not painful.  He tried ibuprofen yesterday with improvement in his overall symptoms.  Symptoms are worse with standing up, trying to eat.  No known COVID or flu exposure.  He did not get either vaccine.  Past medical history of COVID in February 2020, December 2021.  No history of diabetes, hypertension, gallbladder disease, cryptitis, abdominal surgeries, UTI, pyelonephritis, nephrolithiasis.  PMD: None.   History reviewed. No pertinent past medical history.  History reviewed. No pertinent surgical history.  History reviewed. No pertinent family history.  Social History   Tobacco Use  . Smoking status: Former Smoker    Types: Cigarettes  . Smokeless tobacco: Never Used  Substance Use Topics  . Alcohol use: Yes  . Drug use: Yes    Types: Marijuana    No current facility-administered medications for this encounter.  Current Outpatient Medications:  .  amoxicillin-clavulanate (AUGMENTIN) 875-125 MG tablet, Take 1 tablet by mouth 2 (two) times daily. X 7 days, Disp: 14 tablet, Rfl: 0 .  fluticasone (FLONASE) 50 MCG/ACT nasal spray, Place 2 sprays into both nostrils daily., Disp: 16 g, Rfl: 0 .  ibuprofen (ADVIL) 600 MG tablet,  Take 1 tablet (600 mg total) by mouth every 6 (six) hours as needed., Disp: 30 tablet, Rfl: 0 .  ondansetron (ZOFRAN ODT) 8 MG disintegrating tablet, 1/2- 1 tablet q 8 hr prn nausea, vomiting, Disp: 20 tablet, Rfl: 0  No Known Allergies   ROS  As noted in HPI.   Physical Exam  BP 133/85 (BP Location: Left Arm)   Pulse 99   Temp (!) 101.7 F (38.7 C) (Oral)   Resp 18   SpO2 96%   Constitutional: Well developed, well nourished, no acute distress appears ill. Eyes: PERRL, EOMI, conjunctiva normal bilaterally HENT: Normocephalic, atraumatic,mucus membranes moist. positive nasal congestion.  Erythematous, swollen turbinates.  Positive right-sided maxillary sinus tenderness.  Right upper third molar tender to palpation.  No surrounding gingival swelling.  No expressible drainage.  No facial swelling, drooling, trismus. Neck: Positive cervical lymphadenopathy Respiratory: Clear to auscultation bilaterally, no rales, no wheezing, no rhonchi Cardiovascular: Normal rate and rhythm, no murmurs, no gallops, no rubs GI: Soft, nondistended, normal bowel sounds, positive left flank tenderness, no rebound, no guarding.  No other abdominal tenderness. Back: Left CVAT skin: No rash, skin intact Musculoskeletal: No edema, no tenderness, no deformities Neurologic: Alert & oriented x 3, CN III-XII grossly intact, no motor deficits, sensation grossly intact Psychiatric: Speech and behavior appropriate   ED Course   Medications  acetaminophen (TYLENOL) tablet 650 mg (650 mg Oral Given 06/03/20 1926)  ondansetron (ZOFRAN-ODT) disintegrating tablet 4 mg (4 mg Oral Given 06/03/20 2025)    Orders Placed This Encounter  Procedures  .  Covid-19, Flu A+B ([facility])    Standing Status:   Standing    Number of Occurrences:   1  . Urine Culture    Standing Status:   Standing    Number of Occurrences:   1  . POCT urinalysis dipstick    Standing Status:   Standing    Number of Occurrences:   1   No  results found for this or any previous visit (from the past 24 hour(s)). No results found.  Results for orders placed or performed during the hospital encounter of 06/03/20  Covid-19, Flu A+B ([facility])   Specimen: Nasopharyngeal   Naso  Result Value Ref Range   SARS-CoV-2, NAA Not Detected Not Detected   Influenza A, NAA Not Detected Not Detected   Influenza B, NAA Not Detected Not Detected  Urine Culture   Specimen: Urine, Clean Catch  Result Value Ref Range   Specimen Description URINE, CLEAN CATCH    Special Requests NONE    Culture (A)     <10,000 COLONIES/mL INSIGNIFICANT GROWTH Performed at [facility] Lab, [address] [place], [place] [address]    Report Status 06/06/2020 FINAL   POCT urinalysis dipstick  Result Value Ref Range   Color, UA brown (A) yellow   Clarity, UA cloudy (A) clear   Glucose, UA negative negative mg/dL   Bilirubin, UA small (A) negative   Ketones, POC UA moderate (40) (A) negative mg/dL   Spec Grav, UA 2.585 2.778 - 1.025   Blood, UA negative negative   pH, UA 6.0 5.0 - 8.0   Protein Ur, POC =30 (A) negative mg/dL   Urobilinogen, UA 4.0 (A) 0.2 or 1.0 E.U./dL   Nitrite, UA Negative Negative   Leukocytes, UA Trace (A) Negative     ED Clinical Impression  1. Acute non-recurrent maxillary sinusitis   2. Encounter for screening laboratory testing for COVID-19 virus   3. Dental infection      ED Assessment/Plan  Patient was given Tylenol for fever.  He declined Zofran.  Unsure as to whether the symptoms are from an infected tooth, sinusitis, COVID or flu.  He does have some left flank and CVA tenderness, so we will check a urinalysis, in addition to COVID, flu.  He will qualify for antiviral treatment if COVID is positive based on BMI.  May be out of the window for flu treatment.  Pt abd exam is benign, no peritoneal signs. No evidence of surgical abd. Doubt SBO, mesenteric ischemia, appendicitis, hepatitis, cholecystitis, pancreatitis,  or perforated viscus.  Plan to send home with saline nasal irrigation, Mucinex, Flonase for sinus infection.  Augmentin for 7 days which will also cover dental infection.  Zofran as needed for nausea, vomiting, Tylenol/ibuprofen, push electrolyte containing fluids.  Will provide primary care list for ongoing care and order assistance in finding a PMD.  Strict ER return precautions given.  Urinalysis: Appears dehydrated.  No hematuria.  He has trace leuks, will send this off for urine culture prior to treating for UTI.    Addendum: COVID, flu, urine culture negative.  Discussed labs,  MDM, treatment plan, and plan for follow-up with patient Discussed sn/sx that should prompt return to the ED. patient agrees with plan.   Meds ordered this encounter  Medications  . acetaminophen (TYLENOL) tablet 650 mg  . ondansetron (ZOFRAN-ODT) disintegrating tablet 4 mg  . amoxicillin-clavulanate (AUGMENTIN) 875-125 MG tablet    Sig: Take 1 tablet by mouth 2 (two) times daily. X 7  days    Dispense:  14 tablet    Refill:  0  . ibuprofen (ADVIL) 600 MG tablet    Sig: Take 1 tablet (600 mg total) by mouth every 6 (six) hours as needed.    Dispense:  30 tablet    Refill:  0  . ondansetron (ZOFRAN ODT) 8 MG disintegrating tablet    Sig: 1/2- 1 tablet q 8 hr prn nausea, vomiting    Dispense:  20 tablet    Refill:  0  . fluticasone (FLONASE) 50 MCG/ACT nasal spray    Sig: Place 2 sprays into both nostrils daily.    Dispense:  16 g    Refill:  0      *This clinic note was created using Scientist, clinical (histocompatibility and immunogenetics). Therefore, there may be occasional mistakes despite careful proofreading. ?    Domenick Gong, MD 06/03/20 2049    Domenick Gong, MD 06/06/20 1213

## 2020-06-05 LAB — COVID-19, FLU A+B NAA
Influenza A, NAA: NOT DETECTED
Influenza B, NAA: NOT DETECTED
SARS-CoV-2, NAA: NOT DETECTED

## 2020-06-06 LAB — URINE CULTURE: Culture: <10000 — AB
